# Patient Record
Sex: Male | Born: 1965 | Hispanic: No | Smoking: Current every day smoker
Health system: Southern US, Community
[De-identification: ages and names within clinical notes are randomized; demographics above are authoritative.]

## PROBLEM LIST (undated history)

## (undated) DIAGNOSIS — M5431 Sciatica, right side: Secondary | ICD-10-CM

## (undated) DIAGNOSIS — I639 Cerebral infarction, unspecified: Secondary | ICD-10-CM

## (undated) DIAGNOSIS — F419 Anxiety disorder, unspecified: Secondary | ICD-10-CM

## (undated) DIAGNOSIS — F32A Depression, unspecified: Secondary | ICD-10-CM

## (undated) DIAGNOSIS — J342 Deviated nasal septum: Secondary | ICD-10-CM

## (undated) DIAGNOSIS — F29 Unspecified psychosis not due to a substance or known physiological condition: Secondary | ICD-10-CM

## (undated) HISTORY — DX: Sciatica, right side: M54.31

## (undated) HISTORY — DX: Deviated nasal septum: J34.2

## (undated) HISTORY — PX: KNEE SURGERY: SHX244

## (undated) HISTORY — DX: Anxiety disorder, unspecified: F41.9

## (undated) HISTORY — DX: Depression, unspecified: F32.A

## (undated) HISTORY — DX: Unspecified psychosis not due to a substance or known physiological condition: F29

## (undated) HISTORY — DX: Cerebral infarction, unspecified: I63.9

---

## 2020-08-06 ENCOUNTER — Emergency Department (HOSPITAL_COMMUNITY): Payer: Medicare Other

## 2020-08-06 ENCOUNTER — Emergency Department (HOSPITAL_COMMUNITY)
Admission: EM | Admit: 2020-08-06 | Discharge: 2020-08-07 | Disposition: A | Discharge status: Home / Self Care | Payer: Medicare Other | Attending: Emergency Medicine | Admitting: Emergency Medicine

## 2020-08-06 ENCOUNTER — Encounter (HOSPITAL_COMMUNITY): Payer: Self-pay | Admitting: Emergency Medicine

## 2020-08-06 ENCOUNTER — Other Ambulatory Visit: Payer: Self-pay

## 2020-08-06 DIAGNOSIS — R29810 Facial weakness: Secondary | ICD-10-CM | POA: Diagnosis not present

## 2020-08-06 DIAGNOSIS — R0609 Other forms of dyspnea: Secondary | ICD-10-CM | POA: Diagnosis not present

## 2020-08-06 DIAGNOSIS — I6350 Cerebral infarction due to unspecified occlusion or stenosis of unspecified cerebral artery: Secondary | ICD-10-CM | POA: Diagnosis not present

## 2020-08-06 DIAGNOSIS — Z20822 Contact with and (suspected) exposure to covid-19: Secondary | ICD-10-CM | POA: Insufficient documentation

## 2020-08-06 DIAGNOSIS — Z5321 Procedure and treatment not carried out due to patient leaving prior to being seen by health care provider: Secondary | ICD-10-CM | POA: Diagnosis not present

## 2020-08-06 LAB — COMPREHENSIVE METABOLIC PANEL
ALT: 47 U/L — ABNORMAL HIGH (ref 0–44)
AST: 37 U/L (ref 15–41)
Albumin: 4.1 g/dL (ref 3.5–5.0)
Alkaline Phosphatase: 83 U/L (ref 38–126)
Anion gap: 10 (ref 5–15)
BUN: 10 mg/dL (ref 6–20)
CO2: 27 mmol/L (ref 22–32)
Calcium: 9.7 mg/dL (ref 8.9–10.3)
Chloride: 100 mmol/L (ref 98–111)
Creatinine, Ser: 0.68 mg/dL (ref 0.61–1.24)
GFR calc Af Amer: >60 mL/min (ref >60)
GFR calc non Af Amer: >60 mL/min (ref >60)
Glucose, Bld: 95 mg/dL (ref 70–99)
Potassium: 4.2 mmol/L (ref 3.5–5.1)
Sodium: 137 mmol/L (ref 135–145)
Total Bilirubin: 1.4 mg/dL — ABNORMAL HIGH (ref 0.3–1.2)
Total Protein: 7.1 g/dL (ref 6.5–8.1)

## 2020-08-06 LAB — DIFFERENTIAL
Abs Immature Granulocytes: 0.02 10*3/uL (ref 0.00–0.07)
Basophils Absolute: 0.1 10*3/uL (ref 0.0–0.1)
Basophils Relative: 1 %
Eosinophils Absolute: 0.1 10*3/uL (ref 0.0–0.5)
Eosinophils Relative: 1 %
Immature Granulocytes: 0 %
Lymphocytes Relative: 19 %
Lymphs Abs: 1.1 10*3/uL (ref 0.7–4.0)
Monocytes Absolute: 0.7 10*3/uL (ref 0.1–1.0)
Monocytes Relative: 12 %
Neutro Abs: 3.9 10*3/uL (ref 1.7–7.7)
Neutrophils Relative %: 67 %

## 2020-08-06 LAB — CBC
HCT: 49.5 % (ref 39.0–52.0)
Hemoglobin: 16.4 g/dL (ref 13.0–17.0)
MCH: 33 pg (ref 26.0–34.0)
MCHC: 33.1 g/dL (ref 30.0–36.0)
MCV: 99.6 fL (ref 80.0–100.0)
Platelets: 259 10*3/uL (ref 150–400)
RBC: 4.97 MIL/uL (ref 4.22–5.81)
RDW: 12.1 % (ref 11.5–15.5)
WBC: 5.9 10*3/uL (ref 4.0–10.5)
nRBC: 0 % (ref 0.0–0.2)

## 2020-08-06 LAB — CBG MONITORING, ED: Glucose-Capillary: 87 mg/dL (ref 70–99)

## 2020-08-06 LAB — I-STAT CHEM 8, ED
BUN: 12 mg/dL (ref 6–20)
Calcium, Ion: 1.17 mmol/L (ref 1.15–1.40)
Chloride: 102 mmol/L (ref 98–111)
Creatinine, Ser: 0.7 mg/dL (ref 0.61–1.24)
Glucose, Bld: 93 mg/dL (ref 70–99)
HCT: 50 % (ref 39.0–52.0)
Hemoglobin: 17 g/dL (ref 13.0–17.0)
Potassium: 4.2 mmol/L (ref 3.5–5.1)
Sodium: 140 mmol/L (ref 135–145)
TCO2: 28 mmol/L (ref 22–32)

## 2020-08-06 LAB — PROTIME-INR
INR: 0.9 (ref 0.8–1.2)
Prothrombin Time: 12.1 seconds (ref 11.4–15.2)

## 2020-08-06 LAB — APTT: aPTT: 33 seconds (ref 24–36)

## 2020-08-06 NOTE — ED Notes (Signed)
Pt states that he does not want to stay due to the wait time. Pt advised to return if symptoms worsen.

## 2020-08-06 NOTE — ED Triage Notes (Signed)
Pt here via EMS from [facility] after he presented there for f/u from facial droop yesterday that began at 5pm but then resolved. Pt also endorses shob with exertion for one month. Four negative covid tests. Quit smoking cold [place] on Sunday night.

## 2020-08-07 ENCOUNTER — Telehealth: Payer: Self-pay | Admitting: *Deleted

## 2020-08-07 NOTE — Telephone Encounter (Signed)
Pt wife called for referral for pt to [facility].  RNCM reviewed chart to find that pt left without being seen.  RNCM explained to wife that because he LWBS, we can not refer him to a specialist.

## 2020-08-11 ENCOUNTER — Other Ambulatory Visit: Payer: Self-pay | Admitting: Physician Assistant

## 2020-08-11 DIAGNOSIS — I639 Cerebral infarction, unspecified: Secondary | ICD-10-CM

## 2020-08-11 DIAGNOSIS — F17200 Nicotine dependence, unspecified, uncomplicated: Secondary | ICD-10-CM

## 2020-08-13 ENCOUNTER — Telehealth: Payer: Self-pay

## 2020-08-13 NOTE — Telephone Encounter (Signed)
NOTES ON FILE FROM  [facility] [phone] SENT REFERRAL TO SCHEDULING 

## 2020-08-14 ENCOUNTER — Encounter: Payer: Self-pay | Admitting: *Deleted

## 2020-08-14 ENCOUNTER — Ambulatory Visit
Admission: RE | Admit: 2020-08-14 | Discharge: 2020-08-14 | Disposition: A | Discharge status: Home / Self Care | Payer: Medicare Other | Source: Ambulatory Visit | Attending: Physician Assistant | Admitting: Physician Assistant

## 2020-08-14 DIAGNOSIS — I639 Cerebral infarction, unspecified: Secondary | ICD-10-CM

## 2020-08-18 ENCOUNTER — Ambulatory Visit (INDEPENDENT_AMBULATORY_CARE_PROVIDER_SITE_OTHER): Payer: Medicare Other | Admitting: Diagnostic Neuroimaging

## 2020-08-18 ENCOUNTER — Telehealth: Payer: Self-pay | Admitting: Diagnostic Neuroimaging

## 2020-08-18 ENCOUNTER — Other Ambulatory Visit: Payer: Self-pay

## 2020-08-18 ENCOUNTER — Encounter: Payer: Self-pay | Admitting: Diagnostic Neuroimaging

## 2020-08-18 VITALS — BP 134/87 | HR 64 | Ht 71.0 in | Wt 166.4 lb

## 2020-08-18 DIAGNOSIS — I63411 Cerebral infarction due to embolism of right middle cerebral artery: Secondary | ICD-10-CM

## 2020-08-18 DIAGNOSIS — E782 Mixed hyperlipidemia: Secondary | ICD-10-CM

## 2020-08-18 NOTE — Telephone Encounter (Signed)
Medicare order sent to GI. No auth they will reach out to the patient to schedule.  

## 2020-08-18 NOTE — Progress Notes (Signed)
[facility]  PATIENT: Kevin Clarke. DOB: 13-Jul-1966  REFERRING CLINICIAN: Roger Kill, * HISTORY FROM: patient  REASON FOR VISIT: new consult    HISTORICAL  CHIEF COMPLAINT:  Chief Complaint  Patient presents with  . Cerebrovascular Accident    rm 7 New Pt  sig other- [name]    HISTORY OF PRESENT ILLNESS:   54 year old male here for evaluation of stroke.  08/05/2020 patient had sudden onset of not feeling well, left side not working.  Patient significant other noted that right side of face seem to be moving abnormally.  Patient had been having elevated blood pressures for the prior 1 week.  Patient went to urgent care on 08/06/2020 and then referred to ER for further evaluation.  Patient had CT scan of the head which showed acute to subacute right basal ganglia ischemic infarct.  Patient left before evaluation could be completed.  Since that time patient continues to have some weakness and coordination problems with the left arm and left leg.  He has some mild intermittent slurred speech.  Blood pressures have improved.  Patient was not on any medications at the time of stroke.  Now he is on aspirin, Plavix, atorvastatin.  Patient has history of smoking and alcohol abuse.  Patient is trying to reduce these.   REVIEW OF SYSTEMS: Full 14 system review of systems performed and negative with exception of: As per HPI.  ALLERGIES: Allergies  Allergen Reactions  . Corn-Containing Products Anaphylaxis  . Rosuvastatin Other (See Comments)    myalgias  . Penicillins Other (See Comments)    Allergy reaction unknown    HOME MEDICATIONS: Outpatient Medications Prior to Visit  Medication Sig Dispense Refill  . aspirin EC 81 MG tablet Take 162 mg by mouth daily. Swallow whole.    Marland Kitchen atorvastatin (LIPITOR) 10 MG tablet Take 10 mg by mouth daily.    . clopidogrel (PLAVIX) 75 MG tablet Take 75 mg by mouth daily.    Marland Kitchen co-enzyme Q-10 30 MG capsule Take 30  mg by mouth 3 (three) times daily.     No facility-administered medications prior to visit.    PAST MEDICAL HISTORY: Past Medical History:  Diagnosis Date  . Anxiety   . CVA (cerebral vascular accident) (HCC)   . Depression   . Deviated nasal septum   . Psychosis (HCC)   . Sciatica of right side     PAST SURGICAL HISTORY: Past Surgical History:  Procedure Laterality Date  . KNEE SURGERY Right    x 7    FAMILY HISTORY: Family History  Problem Relation Age of Onset  . Heart disease Mother   . Hypertension Mother   . Heart disease Father        CABG  . Hypertension Father   . Arthritis/Rheumatoid Father   . Multiple sclerosis Sister   . Stroke Maternal Aunt   . Stroke Maternal Uncle     SOCIAL HISTORY: Social History   Socioeconomic History  . Marital status: Significant Other    Spouse name: [name]  . Number of children: 2  . Years of education: Not on file  . Highest education level: Bachelor's degree (e.g., BA, AB, BS)  Occupational History    Comment: disability  Tobacco Use  . Smoking status: Former Smoker    Packs/day: 1.50    Years: 25.00    Pack years: 51.50    Quit date: 08/03/2020    Years since quitting: 0.0  . Smokeless tobacco: Never  Used  . Tobacco comment: 08/18/20 trying to quit   Vaping Use  . Vaping Use: Never used  Substance and Sexual Activity  . Alcohol use: Yes    Comment: 08/18/20 3-4  beers daily  . Drug use: Never  . Sexual activity: Not on file  Other Topics Concern  . Not on file  Social History Narrative   Caffeine- Mtn Dew a few a week, sweet tea 1 daily or less   Social Determinants of Health   Financial Resource Strain:   . Difficulty of Paying Living Expenses: Not on file  Food Insecurity:   . Worried About Programme researcher, broadcasting/film/video in the Last Year: Not on file  . Ran Out of Food in the Last Year: Not on file  Transportation Needs:   . Lack of Transportation (Medical): Not on file  . Lack of Transportation  (Non-Medical): Not on file  Physical Activity:   . Days of Exercise per Week: Not on file  . Minutes of Exercise per Session: Not on file  Stress:   . Feeling of Stress : Not on file  Social Connections:   . Frequency of Communication with Friends and Family: Not on file  . Frequency of Social Gatherings with Friends and Family: Not on file  . Attends Religious Services: Not on file  . Active Member of Clubs or Organizations: Not on file  . Attends Banker Meetings: Not on file  . Marital Status: Not on file  Intimate Partner Violence:   . Fear of Current or Ex-Partner: Not on file  . Emotionally Abused: Not on file  . Physically Abused: Not on file  . Sexually Abused: Not on file     PHYSICAL EXAM  GENERAL EXAM/CONSTITUTIONAL: Vitals:  Vitals:   08/18/20 0817  BP: 134/87  Pulse: 64  Weight: 166 lb 6.4 oz (75.5 kg)  Height: 5\' 11"  (1.803 m)     Body mass index is 23.21 kg/m. Wt Readings from Last 3 Encounters:  08/18/20 166 lb 6.4 oz (75.5 kg)  08/06/20 161 lb (73 kg)     Patient is in no distress; well developed, nourished and groomed; neck is supple  CARDIOVASCULAR:  Examination of carotid arteries is normal; no carotid bruits  Regular rate and rhythm, no murmurs  Examination of peripheral vascular system by observation and palpation is normal  EYES:  Ophthalmoscopic exam of optic discs and posterior segments is normal; no papilledema or hemorrhages  No exam data present  MUSCULOSKELETAL:  Gait, strength, tone, movements noted in Neurologic exam below  NEUROLOGIC: MENTAL STATUS:  No flowsheet data found.  awake, alert, oriented to person, place and time  recent and remote memory intact  normal attention and concentration  language fluent, comprehension intact, naming intact  fund of knowledge appropriate  CRANIAL NERVE:   2nd - no papilledema on fundoscopic exam  2nd, 3rd, 4th, 6th - pupils equal and reactive to light,  visual fields full to confrontation, extraocular muscles intact, no nystagmus  5th - facial sensation --> DECR ON LEFT SIDE  7th - facial strength symmetric  8th - hearing intact  9th - palate elevates symmetrically, uvula midline  11th - shoulder shrug symmetric  12th - tongue protrusion midline  MOTOR:   normal bulk and tone, full strength in the BUE, BLE; EXCEPT LEFT DELTOID AND LEFT HIP FLEX 4+  SENSORY:   normal and symmetric to light touch, temperature, vibration; EXCEPT DECR IN LEFT ARM AND LEFT LEG  COORDINATION:   finger-nose-finger, fine finger movements SLOW ON LEFT  REFLEXES:   deep tendon reflexes present and symmetric  GAIT/STATION:   narrow based gait     DIAGNOSTIC DATA (LABS, IMAGING, TESTING) - I reviewed patient records, labs, notes, testing and imaging myself where available.  Lab Results  Component Value Date   WBC 5.9 08/06/2020   HGB 17.0 08/06/2020   HCT 50.0 08/06/2020   MCV 99.6 08/06/2020   PLT 259 08/06/2020      Component Value Date/Time   NA 140 08/06/2020 1656   K 4.2 08/06/2020 1656   CL 102 08/06/2020 1656   CO2 27 08/06/2020 1644   GLUCOSE 93 08/06/2020 1656   BUN 12 08/06/2020 1656   CREATININE 0.70 08/06/2020 1656   CALCIUM 9.7 08/06/2020 1644   PROT 7.1 08/06/2020 1644   ALBUMIN 4.1 08/06/2020 1644   AST 37 08/06/2020 1644   ALT 47 (H) 08/06/2020 1644   ALKPHOS 83 08/06/2020 1644   BILITOT 1.4 (H) 08/06/2020 1644   GFRNONAA >60 08/06/2020 1644   GFRAA >60 08/06/2020 1644   No results found for: CHOL, HDL, LDLCALC, LDLDIRECT, TRIG, CHOLHDL No results found for: ACZY6A No results found for: VITAMINB12 No results found for: TSH   CHEMISTRY PANELS [facility] 08/11/2020 Component    LDL Direct  Total Cholesterol  Triglycerides  HDL Cholesterol  Total Chol / HDL Cholesterol  Non-HDL Cholesterol  Coronary Heart Disease Risk Table  TSH  Component 08/11/2020 08/11/2020      LDL  Direct 153High   --  Total Cholesterol 233High   --  Triglycerides 208High   --  HDL Cholesterol 40 --  Total Chol / HDL Cholesterol 5.8High   --  Non-HDL Cholesterol 193 --  Coronary Heart Disease Risk Table -- --  TSH -- 1.523     08/14/20 Carotid u/s - Color duplex indicates moderate heterogeneous plaque with no hemodynamically significant stenosis by duplex criteria in the extracranial cerebrovascular circulation.  08/06/20 CT head [I reviewed images myself and agree with interpretation. -VRP]  - 3 cm acute/early subacute right basal ganglia infarct affecting the caudate and lentiform nuclei as well as internal capsule.   ASSESSMENT AND PLAN  54 y.o. year old male here with right basal ganglia ischemic infarction with resultant left-sided numbness and weakness.  Symptoms likely due to small vessel thrombosis in setting of hypertension, hyperlipidemia, tobacco abuse and alcohol abuse.  Dx:  1. Cerebrovascular accident (CVA) due to embolism of right middle cerebral artery (HCC)   2. Mixed hyperlipidemia      PLAN:  RIGHT BASAL GANGLIA STROKE - continue aspirin 81mg  + plavix 75mg  daily x 3 months; then reduce to plavix 75mg  daily - continue atorvastatin 10mg   - monitor BP - check MRI, MRA, echo - smoking cessation reviewed; avoid alcohol    Orders Placed This Encounter  Procedures  . MR BRAIN WO CONTRAST  . MR ANGIO HEAD WO CONTRAST  . ECHOCARDIOGRAM COMPLETE BUBBLE STUDY   Return for pending if symptoms worsen or fail to improve, return to PCP.    , MD 08/18/2020, 9:00 AM Certified in Neurology, Neurophysiology and Neuroimaging  [facility] [address], [address] [place], 08/20/2020 [facility] [phone]

## 2020-08-18 NOTE — Patient Instructions (Addendum)
-   continue aspirin 81mg  + plavix 75mg  daily x 3 months; then reduce to plavix 75mg  daily - continue atorvastatin 10mg   - monitor BP - check MRI, MRA, echo - smoking cessation; avoid alcohol

## 2020-08-20 ENCOUNTER — Other Ambulatory Visit: Payer: Self-pay | Admitting: Diagnostic Neuroimaging

## 2020-08-20 DIAGNOSIS — Z77018 Contact with and (suspected) exposure to other hazardous metals: Secondary | ICD-10-CM

## 2020-08-21 ENCOUNTER — Other Ambulatory Visit: Payer: Self-pay

## 2020-08-21 ENCOUNTER — Ambulatory Visit
Admission: RE | Admit: 2020-08-21 | Discharge: 2020-08-21 | Disposition: A | Discharge status: Home / Self Care | Payer: Medicare Other | Source: Ambulatory Visit | Attending: Diagnostic Neuroimaging | Admitting: Diagnostic Neuroimaging

## 2020-08-21 ENCOUNTER — Ambulatory Visit
Admission: RE | Admit: 2020-08-21 | Discharge: 2020-08-21 | Disposition: A | Discharge status: Home / Self Care | Payer: Medicare Other | Source: Ambulatory Visit | Attending: Physician Assistant | Admitting: Physician Assistant

## 2020-08-21 DIAGNOSIS — F17200 Nicotine dependence, unspecified, uncomplicated: Secondary | ICD-10-CM

## 2020-08-21 DIAGNOSIS — I63411 Cerebral infarction due to embolism of right middle cerebral artery: Secondary | ICD-10-CM

## 2020-08-21 DIAGNOSIS — Z77018 Contact with and (suspected) exposure to other hazardous metals: Secondary | ICD-10-CM

## 2020-08-25 NOTE — Progress Notes (Signed)
Cardiology Office Note   Date:  08/26/2020   ID:  Kevin Clarke., DOB 01-09-1966, MRN [id number]  PCP:  Roger Kill, PA-C  Cardiologist:   No primary care provider on file. Referring:  Roger Kill, PA-C  Chief Complaint  Patient presents with  . Cerebrovascular Accident      History of Present Illness: Kevin Clarke. is a 54 y.o. male who presents for follow up of a right MCA.  He was in the ED for facial numbness and had a CT but he left the ED without being seen he had had his initial evaluation at [facility] urgent care and was sent to the [facility] ED where he had a CT.  However, there was a long wait and he did not want to stay.  He has been seen by neurology as an outpatient.    He has been treated with Plavix and aspirin.  TEE with bubble study is planned.  The patient has no prior cardiac history before this.  He wonders if some of the symptoms could have been related to an upper respiratory infection he had a lot of coughing he had been doing.  He had not seen a doctor in years.  He does have a strong family history of vascular disease.  He has a smoking history and is now trying to cut back.  However, he has never had any prior cardiac testing or treatment.  He said that the day that his event happened he had some slurred speech and facial tingling.  They presented more than 24 hours after the event no when they were encouraged by family member to go get checked out.  His partner took him to the urgent care.  He now has some residual left-sided weakness and some slurred words.  He is not describing palpitations, presyncope or syncope.  He has had no chest pressure, neck or arm discomfort.  Has had no weight gain or edema.   Past Medical History:  Diagnosis Date  . Anxiety   . CVA (cerebral vascular accident) (HCC)   . Depression   . Deviated nasal septum   . Psychosis (HCC)   . Sciatica of right side     Past Surgical History:  Procedure Laterality  Date  . KNEE SURGERY Right    x 7     Current Outpatient Medications  Medication Sig Dispense Refill  . aspirin EC 81 MG tablet Take 162 mg by mouth daily. Swallow whole.    Marland Kitchen atorvastatin (LIPITOR) 10 MG tablet Take 10 mg by mouth daily.    . clopidogrel (PLAVIX) 75 MG tablet Take 75 mg by mouth daily.    Marland Kitchen co-enzyme Q-10 30 MG capsule Take 30 mg by mouth daily.      No current facility-administered medications for this visit.    Allergies:   Corn-containing products, Rosuvastatin, and Penicillins    Social History:  The patient  reports that he quit smoking about 3 weeks ago. He has a 37.50 pack-year smoking history. He has never used smokeless tobacco. He reports current alcohol use. He reports that he does not use drugs.   Family History:  The patient's family history includes Arthritis/Rheumatoid in his father; Heart disease in his mother; Heart disease (age of onset: 10) in his father; Hypertension in his father and mother; Multiple sclerosis in his sister; Stroke in his maternal aunt and maternal uncle.    ROS:  Please see the history of present illness.  Otherwise, review of systems are positive for ED.   All other systems are reviewed and negative.    PHYSICAL EXAM: VS:  BP 116/82   Pulse 87   Ht 5\' 11"  (1.803 m)   Wt 165 lb 6.4 oz (75 kg)   SpO2 97%   BMI 23.07 kg/m  , BMI Body mass index is 23.07 kg/m.  GENERAL:  Well appearing HEENT:  Pupils equal round and reactive, fundi not visualized, oral mucosa unremarkable NECK:  No jugular venous distention, waveform within normal limits, carotid upstroke brisk and symmetric, no bruits, no thyromegaly LYMPHATICS:  No cervical, inguinal adenopathy LUNGS:  Clear to auscultation bilaterally BACK:  No CVA tenderness CHEST:  Unremarkable HEART:  PMI not displaced or sustained,S1 and S2 within normal limits, no S3, no S4, no clicks, no rubs, no murmurs ABD:  Flat, positive bowel sounds normal in frequency in pitch, no  bruits, no rebound, no guarding, no midline pulsatile mass, no hepatomegaly, no splenomegaly EXT:  2 plus pulses throughout, no edema, no cyanosis no clubbing SKIN:  No rashes no nodules NEURO:  Cranial nerves II through XII grossly intact, motor grossly intact throughout PSYCH:  Cognitively intact, oriented to person place and time    EKG:  EKG is not ordered today. The ekg ordered 08/06/2020 demonstrates normal sinus rhythm, rate 72, axis within normal limits, intervals within normal limits, no acute ST-T wave changes.   Recent Labs: 08/06/2020: ALT 47; BUN 12; Creatinine, Ser 0.70; Hemoglobin 17.0; Platelets 259; Potassium 4.2; Sodium 140    Lipid Panel No results found for: CHOL, TRIG, HDL, CHOLHDL, VLDL, LDLCALC, LDLDIRECT    Wt Readings from Last 3 Encounters:  08/26/20 165 lb 6.4 oz (75 kg)  08/18/20 166 lb 6.4 oz (75.5 kg)  08/06/20 161 lb (73 kg)      Other studies Reviewed: Additional studies/ records that were reviewed today include: ED records.  MRI.  Neurology note. Review of the above records demonstrates:  Please see elsewhere in the note.     ASSESSMENT AND PLAN:  CVA:  This was embolic to the right MCA.   Echo with bubble study as been ordered.   Carotid was negative for significant plaque.  I suspect this was atheroembolic.  He does have aortic atherosclerosis noted on his CT.  We talked quite a bit about this.  I am going to order a 4-week event monitor to make sure he does not have any atrial fibrillation though I think this is low yield.  I will look for the results of the echo but doubt that a TEE would be helpful as I think this is low risk.  Instead he needs aggressive risk reduction for his cardiovascular risk factors and in particular his dyslipidemia tobacco abuse.  HTN: His blood pressure is controlled.  He will keep a blood pressure diary.  TOBACCO ABUSE: He is cutting back and with talked about this at length.  DYSLIPIDEMIA: He was a started on  Lipitor.  His LDL was 153 with a total of 233.  He has changed his diet completely.  I think her goal LDL should be less than 70 and he will probably need a change in his statin but I will review these results.  ELEVATED CORONARY CALCIUM: I will likely screen him with a POET (Plain Old Exercise Treadmill) in the weeks to come.  He has no active chest pain symptoms.  ED: I would consider Viagra but only after he is recovered from his  stroke and he has had stress testing.  COVID VACCINE: He does not want to talk about the vaccine.   Current medicines are reviewed at length with the patient today.  The patient does not have concerns regarding medicines.  The following changes have been made:  no change  Labs/ tests ordered today include:   Orders Placed This Encounter  Procedures  . CARDIAC EVENT MONITOR     Disposition:   FU with me in six weeks.      Signed, Rollene Rotunda, MD  08/26/2020 5:25 PM    [facility]

## 2020-08-26 ENCOUNTER — Ambulatory Visit (INDEPENDENT_AMBULATORY_CARE_PROVIDER_SITE_OTHER): Payer: Medicare Other | Admitting: Cardiology

## 2020-08-26 ENCOUNTER — Encounter: Payer: Self-pay | Admitting: Cardiology

## 2020-08-26 ENCOUNTER — Other Ambulatory Visit: Payer: Self-pay

## 2020-08-26 VITALS — BP 116/82 | HR 87 | Ht 71.0 in | Wt 165.4 lb

## 2020-08-26 DIAGNOSIS — I63411 Cerebral infarction due to embolism of right middle cerebral artery: Secondary | ICD-10-CM

## 2020-08-26 DIAGNOSIS — R002 Palpitations: Secondary | ICD-10-CM

## 2020-08-26 NOTE — Patient Instructions (Signed)
Medication Instructions:  The current medical regimen is effective;  continue present plan and medications.  *If you need a refill on your cardiac medications before your next appointment, please call your pharmacy*  Testing/Procedures: Your physician has recommended that you wear an event monitor 30 day. Event monitors are medical devices that record the hearts electrical activity. Doctors most often [place] these monitors to diagnose arrhythmias. Arrhythmias are problems with the speed or rhythm of the heartbeat. The monitor is a small, portable device. You can wear one while you do your normal daily activities. This is usually used to diagnose what is causing palpitations/syncope (passing out). This monitor will be mailed to you, someone will call and discuss this before mailing.   Follow-Up: At [facility], you and your health needs are our priority.  As part of our continuing mission to provide you with exceptional heart care, we have created designated Provider Care Teams.  These Care Teams include your primary Cardiologist (physician) and Advanced Practice Providers (APPs -  Physician Assistants and Nurse Practitioners) who all work together to provide you with the care you need, when you need it.  We recommend signing up for the patient portal called "MyChart".  Sign up information is provided on this After Visit Summary.  MyChart is used to connect with patients for Virtual Visits (Telemedicine).  Patients are able to view lab/test results, encounter notes, upcoming appointments, etc.  Non-urgent messages can be sent to your provider as well.   To learn more about what you can do with MyChart, go to [email or web address].    Your next appointment:   6 week(s)  The format for your next appointment:   In Person  Provider:   Rollene Rotunda, MD

## 2020-08-27 ENCOUNTER — Telehealth: Payer: Self-pay | Admitting: Radiology

## 2020-08-27 ENCOUNTER — Encounter: Payer: Self-pay | Admitting: *Deleted

## 2020-08-27 NOTE — Telephone Encounter (Signed)
Enrolled patient for a 30 day [employer] Event Monitor to be mailed to patients home  

## 2020-09-03 ENCOUNTER — Other Ambulatory Visit: Payer: Self-pay

## 2020-09-03 ENCOUNTER — Ambulatory Visit (HOSPITAL_COMMUNITY): Payer: Medicare Other | Attending: Internal Medicine

## 2020-09-03 DIAGNOSIS — I63411 Cerebral infarction due to embolism of right middle cerebral artery: Secondary | ICD-10-CM | POA: Insufficient documentation

## 2020-09-03 LAB — ECHOCARDIOGRAM COMPLETE BUBBLE STUDY
Area-P 1/2: 3.65 cm2
Calc EF: 54.1 %
S' Lateral: 3.8 cm
Single Plane A2C EF: 55.9 %
Single Plane A4C EF: 54.6 %

## 2020-09-03 MED ORDER — SODIUM CHLORIDE 0.9% FLUSH
10.0000 mL | INTRAVENOUS | Status: AC | PRN
  Administered 2020-09-03: 20 mL via INTRAVENOUS

## 2020-09-04 ENCOUNTER — Encounter: Payer: Self-pay | Admitting: *Deleted

## 2020-10-03 ENCOUNTER — Ambulatory Visit: Payer: Medicare Other | Admitting: Diagnostic Neuroimaging

## 2020-10-09 DIAGNOSIS — E785 Hyperlipidemia, unspecified: Secondary | ICD-10-CM | POA: Insufficient documentation

## 2020-10-09 DIAGNOSIS — I10 Essential (primary) hypertension: Secondary | ICD-10-CM | POA: Insufficient documentation

## 2020-10-09 DIAGNOSIS — I639 Cerebral infarction, unspecified: Secondary | ICD-10-CM | POA: Insufficient documentation

## 2020-10-09 NOTE — Progress Notes (Signed)
Cardiology Office Note   Date:  10/10/2020   ID:  Kevin Dane., DOB 12-11-1966, MRN [id number]  PCP:  Roger Kill, PA-C  Cardiologist:   Rollene Rotunda, MD Referring:  Roger Kill, PA-C  Chief Complaint  Patient presents with   Cerebrovascular Accident      History of Present Illness: Kevin Sheeler. is a 54 y.o. male who presents for follow up of a right MCA stroke  He was in the ED for facial numbness and had a CT but he left the ED without being seen he had had his initial evaluation at [facility] urgent care and was sent to the [facility] ED where he had a CT.  However, there was a long wait and he did not want to stay.  He has been seen by neurology as an outpatient.    He has been treated with Plavix and aspirin.  He had a negative echo with bubble study.    Since I last saw him he has residual left arm weakness and some left leg weakness and problems with his speech.  Despite this he is very active.  He denies any palpitations, presyncope or syncope.  He said no chest pressure, neck or arm discomfort.  He changed his diet.    Past Medical History:  Diagnosis Date   Anxiety    CVA (cerebral vascular accident) (HCC)    Depression    Deviated nasal septum    Psychosis (HCC)    Sciatica of right side     Past Surgical History:  Procedure Laterality Date   KNEE SURGERY Right    x 7     Current Outpatient Medications  Medication Sig Dispense Refill   aspirin EC 81 MG tablet Take 162 mg by mouth daily. Swallow whole.     atorvastatin (LIPITOR) 10 MG tablet Take 10 mg by mouth daily.     clopidogrel (PLAVIX) 75 MG tablet Take 75 mg by mouth daily.     co-enzyme Q-10 30 MG capsule Take 30 mg by mouth daily.      No current facility-administered medications for this visit.   Facility-Administered Medications Ordered in Other Visits  Medication Dose Route Frequency Provider Last Rate Last Admin   sodium chloride flush (NS) 0.9 %  injection 10 mL  10 mL Intravenous PRN Penumalli, Vikram R, MD   20 mL at 09/03/20 1300    Allergies:   Corn-containing products, Rosuvastatin, and Penicillins    ROS:  Please see the history of present illness.   Otherwise, review of systems are positive for ED.   All other systems are reviewed and negative.    PHYSICAL EXAM: VS:  BP 124/74    Pulse 63    Ht 5\' 11"  (1.803 m)    Wt 162 lb (73.5 kg)    SpO2 98%    BMI 22.59 kg/m  , BMI Body mass index is 22.59 kg/m.  GENERAL:  Well appearing NECK:  No jugular venous distention, waveform within normal limits, carotid upstroke brisk and symmetric, no bruits, no thyromegaly LUNGS:  Clear to auscultation bilaterally CHEST:  Unremarkable HEART:  PMI not displaced or sustained,S1 and S2 within normal limits, no S3, no S4, no clicks, no rubs, no murmurs ABD:  Flat, positive bowel sounds normal in frequency in pitch, no bruits, no rebound, no guarding, no midline pulsatile mass, no hepatomegaly, no splenomegaly EXT:  2 plus pulses throughout, no edema, no cyanosis no clubbing  EKG:  EKG is ordered today. The ekg ordered 08/06/2020 demonstrates normal sinus rhythm, rate 63, axis within normal limits, intervals within normal limits, no acute ST-T wave changes.   Recent Labs: 08/06/2020: ALT 47; BUN 12; Creatinine, Ser 0.70; Hemoglobin 17.0; Platelets 259; Potassium 4.2; Sodium 140    Lipid Panel No results found for: CHOL, TRIG, HDL, CHOLHDL, VLDL, LDLCALC, LDLDIRECT    Wt Readings from Last 3 Encounters:  10/10/20 162 lb (73.5 kg)  08/26/20 165 lb 6.4 oz (75 kg)  08/18/20 166 lb 6.4 oz (75.5 kg)      Other studies Reviewed: Additional studies/ records that were reviewed today include: ED records.  MRI.  Neurology note. Review of the above records demonstrates:  Please see elsewhere in the note.     ASSESSMENT AND PLAN:  CVA:  This was embolic to the right MCA.   Echo with bubble study was negative.   He was to have a monitor but  he could not get it working at home he reports because of his Internet.  He became frustrated with that and they did send it back.    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  However, I still want to try to see if we can rule out arrhythmia and we will look to see what kind of monitor for whether he should get an implantable loop.  HTN: His blood pressure is controlled.  He will continue the meds as listed.   TOBACCO ABUSE:    We have talked about the need to stop smoking completely.  DYSLIPIDEMIA:   LDL is 153 previously with a goal LDL less than 70 and I will defer to his primary provider who is following this and recently adjusted his Lipitor.   ELEVATED CORONARY CALCIUM:   I will bring him back for POET (Plain Old Exercise Treadmill)  ED: This is no longer a problem.  No therapy is needed.  COVID VACCINE:   He has not wanted to talk about getting a vaccine.   Current medicines are reviewed at length with the patient today.  The patient does not have concerns regarding medicines.  The following changes have been made:  None  Labs/ tests ordered today include:     Orders Placed This Encounter  Procedures   EXERCISE TOLERANCE TEST (ETT)   EKG 12-Lead     Disposition:   FU with me in  6 months.   Signed, Rollene Rotunda, MD  10/10/2020 12:55 PM    [facility]

## 2020-10-10 ENCOUNTER — Ambulatory Visit (INDEPENDENT_AMBULATORY_CARE_PROVIDER_SITE_OTHER): Payer: Medicare Other | Admitting: Cardiology

## 2020-10-10 ENCOUNTER — Other Ambulatory Visit: Payer: Self-pay

## 2020-10-10 ENCOUNTER — Encounter: Payer: Self-pay | Admitting: Cardiology

## 2020-10-10 ENCOUNTER — Encounter: Payer: Self-pay | Admitting: *Deleted

## 2020-10-10 VITALS — BP 124/74 | HR 63 | Ht 71.0 in | Wt 162.0 lb

## 2020-10-10 DIAGNOSIS — Z720 Tobacco use: Secondary | ICD-10-CM | POA: Diagnosis not present

## 2020-10-10 DIAGNOSIS — I63411 Cerebral infarction due to embolism of right middle cerebral artery: Secondary | ICD-10-CM

## 2020-10-10 DIAGNOSIS — I639 Cerebral infarction, unspecified: Secondary | ICD-10-CM | POA: Diagnosis not present

## 2020-10-10 DIAGNOSIS — E785 Hyperlipidemia, unspecified: Secondary | ICD-10-CM | POA: Diagnosis not present

## 2020-10-10 DIAGNOSIS — I10 Essential (primary) hypertension: Secondary | ICD-10-CM | POA: Diagnosis not present

## 2020-10-10 NOTE — Patient Instructions (Signed)
Medication Instructions:  No changes *If you need a refill on your cardiac medications before your next appointment, please call your pharmacy*   Lab Work: None orderdered If you have labs (blood work) drawn today and your tests are completely normal, you will receive your results only by: Marland Kitchen MyChart Message (if you have MyChart) OR . A paper copy in the mail If you have any lab test that is abnormal or we need to change your treatment, we will call you to review the results.   Testing/Procedures: Your physician has requested that you have an exercise tolerance test. For further information please visit [email or web address] Please also follow instruction sheet, as given. This will take place at [address] [facility], [address].  Do not drink or eat foods with caffeine for 24 hours before the test. (Chocolate, coffee, tea, or energy drinks)  If you use an inhaler, bring it with you to the test.  Do not smoke for 4 hours before the test.  Wear comfortable shoes and clothing.  You will need a COVID test prior to the GXT    Follow-Up: At [facility], you and your health needs are our priority.  As part of our continuing mission to provide you with exceptional heart care, we have created designated Provider Care Teams.  These Care Teams include your primary Cardiologist (physician) and Advanced Practice Providers (APPs -  Physician Assistants and Nurse Practitioners) who all work together to provide you with the care you need, when you need it.  We recommend signing up for the patient portal called "MyChart".  Sign up information is provided on this After Visit Summary.  MyChart is used to connect with patients for Virtual Visits (Telemedicine).  Patients are able to view lab/test results, encounter notes, upcoming appointments, etc.  Non-urgent messages can be sent to your provider as well.   To learn more about what you can do with MyChart, go to [email or web address].    Your next  appointment:   Follow up after the tests

## 2020-10-11 ENCOUNTER — Other Ambulatory Visit (HOSPITAL_COMMUNITY): Payer: Medicare Other

## 2020-10-13 ENCOUNTER — Telehealth: Payer: Self-pay | Admitting: *Deleted

## 2020-10-13 NOTE — Telephone Encounter (Signed)
Left a message for the patient to call back to see about ordering a new cardiac monitor.

## 2020-10-14 ENCOUNTER — Other Ambulatory Visit (HOSPITAL_COMMUNITY)
Admission: RE | Admit: 2020-10-14 | Discharge: 2020-10-14 | Disposition: A | Discharge status: Home / Self Care | Payer: Medicare Other | Source: Ambulatory Visit | Attending: Cardiology | Admitting: Cardiology

## 2020-10-14 DIAGNOSIS — Z01812 Encounter for preprocedural laboratory examination: Secondary | ICD-10-CM | POA: Diagnosis present

## 2020-10-14 DIAGNOSIS — Z20822 Contact with and (suspected) exposure to covid-19: Secondary | ICD-10-CM | POA: Insufficient documentation

## 2020-10-14 LAB — SARS CORONAVIRUS 2 (TAT 6-24 HRS): SARS Coronavirus 2: NEGATIVE

## 2020-10-17 ENCOUNTER — Ambulatory Visit (HOSPITAL_COMMUNITY)
Admission: RE | Admit: 2020-10-17 | Discharge: 2020-10-17 | Disposition: A | Discharge status: Home / Self Care | Payer: Medicare Other | Source: Ambulatory Visit | Attending: Internal Medicine | Admitting: Internal Medicine

## 2020-10-17 ENCOUNTER — Other Ambulatory Visit: Payer: Self-pay

## 2020-10-17 DIAGNOSIS — I639 Cerebral infarction, unspecified: Secondary | ICD-10-CM

## 2020-10-17 LAB — EXERCISE TOLERANCE TEST
Estimated workload: 10.9 METS
Exercise duration (min): 9 min
Exercise duration (sec): 31 s
MPHR: 165 {beats}/min
Peak HR: 148 {beats}/min
Percent HR: 89 %
Rest HR: 71 {beats}/min

## 2020-10-21 NOTE — Telephone Encounter (Signed)
Left a message for the patient to call back to discuss the monitor ordered by Dr. Antoine Poche.

## 2020-10-21 NOTE — Progress Notes (Deleted)
Cardiology Office Note   Date:  10/21/2020   ID:  Kevin Clarke., DOB 1966-01-04, MRN [id number]  PCP:  Roger Kill, PA-C  Cardiologist:   Rollene Rotunda, MD Referring:  Roger Kill, PA-C  No chief complaint on file.     History of Present Illness: Kevin Clarke. is a 54 y.o. male who presents for follow up of a right MCA stroke  He was in the ED for facial numbness and had a CT but he left the ED without being seen he had had his initial evaluation at [facility] urgent care and was sent to the [facility] ED where he had a CT.  However, there was a long wait and he did not want to stay.  He has been seen by neurology as an outpatient.    He has been treated with Plavix and aspirin.  He had a negative echo with bubble study.    ***  *** Since I last saw him he has residual left arm weakness and some left leg weakness and problems with his speech.  Despite this he is very active.  He denies any palpitations, presyncope or syncope.  He said no chest pressure, neck or arm discomfort.  He changed his diet.    Past Medical History:  Diagnosis Date  . Anxiety   . CVA (cerebral vascular accident) (HCC)   . Depression   . Deviated nasal septum   . Psychosis (HCC)   . Sciatica of right side     Past Surgical History:  Procedure Laterality Date  . KNEE SURGERY Right    x 7     Current Outpatient Medications  Medication Sig Dispense Refill  . aspirin EC 81 MG tablet Take 162 mg by mouth daily. Swallow whole.    Marland Kitchen atorvastatin (LIPITOR) 10 MG tablet Take 10 mg by mouth daily.    . clopidogrel (PLAVIX) 75 MG tablet Take 75 mg by mouth daily.    Marland Kitchen co-enzyme Q-10 30 MG capsule Take 30 mg by mouth daily.      No current facility-administered medications for this visit.   Facility-Administered Medications Ordered in Other Visits  Medication Dose Route Frequency Provider Last Rate Last Admin  . sodium chloride flush (NS) 0.9 % injection 10 mL  10 mL  Intravenous PRN Penumalli, Vikram R, MD   20 mL at 09/03/20 1300    Allergies:   Corn-containing products, Rosuvastatin, and Penicillins    ROS:  Please see the history of present illness.   Otherwise, review of systems are positive for ***.   All other systems are reviewed and negative.    PHYSICAL EXAM: VS:  There were no vitals taken for this visit. , BMI There is no height or weight on file to calculate BMI.  GENERAL:  Well appearing NECK:  No jugular venous distention, waveform within normal limits, carotid upstroke brisk and symmetric, no bruits, no thyromegaly LUNGS:  Clear to auscultation bilaterally CHEST:  Unremarkable HEART:  PMI not displaced or sustained,S1 and S2 within normal limits, no S3, no S4, no clicks, no rubs, *** murmurs ABD:  Flat, positive bowel sounds normal in frequency in pitch, no bruits, no rebound, no guarding, no midline pulsatile mass, no hepatomegaly, no splenomegaly EXT:  2 plus pulses throughout, no edema, no cyanosis no clubbing    ***GENERAL:  Well appearing NECK:  No jugular venous distention, waveform within normal limits, carotid upstroke brisk and symmetric, no bruits, no thyromegaly LUNGS:  Clear to auscultation bilaterally CHEST:  Unremarkable HEART:  PMI not displaced or sustained,S1 and S2 within normal limits, no S3, no S4, no clicks, no rubs, no murmurs ABD:  Flat, positive bowel sounds normal in frequency in pitch, no bruits, no rebound, no guarding, no midline pulsatile mass, no hepatomegaly, no splenomegaly EXT:  2 plus pulses throughout, no edema, no cyanosis no clubbing   EKG:  EKG is *** ordered today. The ekg ordered 08/06/2020 demonstrates normal sinus rhythm, rate ***, axis within normal limits, intervals within normal limits, no acute ST-T wave changes.   Recent Labs: 08/06/2020: ALT 47; BUN 12; Creatinine, Ser 0.70; Hemoglobin 17.0; Platelets 259; Potassium 4.2; Sodium 140    Lipid Panel No results found for: CHOL, TRIG,  HDL, CHOLHDL, VLDL, LDLCALC, LDLDIRECT    Wt Readings from Last 3 Encounters:  10/10/20 162 lb (73.5 kg)  08/26/20 165 lb 6.4 oz (75 kg)  08/18/20 166 lb 6.4 oz (75.5 kg)      Other studies Reviewed: Additional studies/ records that were reviewed today include: *** Review of the above records demonstrates:  Please see elsewhere in the note.     ASSESSMENT AND PLAN:  CVA:  ***  his was embolic to the right MCA.   Echo with bubble study was negative.   He was to have a monitor but he could not get it working at home he reports because of his Internet.  He became frustrated with that and they did send it back.    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  However, I still want to try to see if we can rule out arrhythmia and we will look to see what kind of monitor for whether he should get an implantable loop.  HTN: His blood pressure is *** controlled.  He will continue the meds as listed.   TOBACCO ABUSE:    *** We have talked about the need to stop smoking completely.  DYSLIPIDEMIA:  *** LDL is 153 previously with a goal LDL less than 70 and I will defer to his primary provider who is following this and recently adjusted his Lipitor.   ELEVATED CORONARY CALCIUM:   *** I will bring him back for POET (Plain Old Exercise Treadmill)  COVID VACCINE:   He has not wanted to talk about getting a vaccine.   Current medicines are reviewed at length with the patient today.  The patient does not have concerns regarding medicines.  The following changes have been made:  None  Labs/ tests ordered today include:     No orders of the defined types were placed in this encounter.    Disposition:   FU with me in  6 months.   Signed, Rollene Rotunda, MD  10/21/2020 8:36 PM    [facility]

## 2020-10-21 NOTE — Telephone Encounter (Signed)
Left a message for the patient to call back to see about ordering a new cardiac monitor. °

## 2020-10-22 ENCOUNTER — Ambulatory Visit: Payer: Medicare Other | Admitting: Cardiology

## 2020-10-22 ENCOUNTER — Telehealth: Payer: Self-pay | Admitting: *Deleted

## 2020-10-22 NOTE — Telephone Encounter (Signed)
called patient to discuss today's appointment. Per Dr Antoine Poche, patient does not need appointment today . Will need to reschedule for 6 month .    per Dr Antoine Poche , patient does not need w-ifi to wear monitor. If patient has not return monitor,patient should  wear it .

## 2020-12-03 ENCOUNTER — Ambulatory Visit: Payer: Medicare Other | Admitting: Physician Assistant

## 2021-03-05 ENCOUNTER — Telehealth: Payer: Self-pay | Admitting: *Deleted

## 2021-03-05 NOTE — Telephone Encounter (Signed)
A message was left,re: his follow up  Visit.

## 2021-06-16 IMAGING — CR DG ORBITS FOR FOREIGN BODY
3 series · 3 of 3 positions shown · non-contrast
Comparison: None.
COMPARISON: None.

Addendum:
CLINICAL DATA: Metal working/exposure; clearance prior to MRI

EXAM:
ORBITS FOR FOREIGN BODY - 2 VIEW

[w orbit pa (1 of 2)]
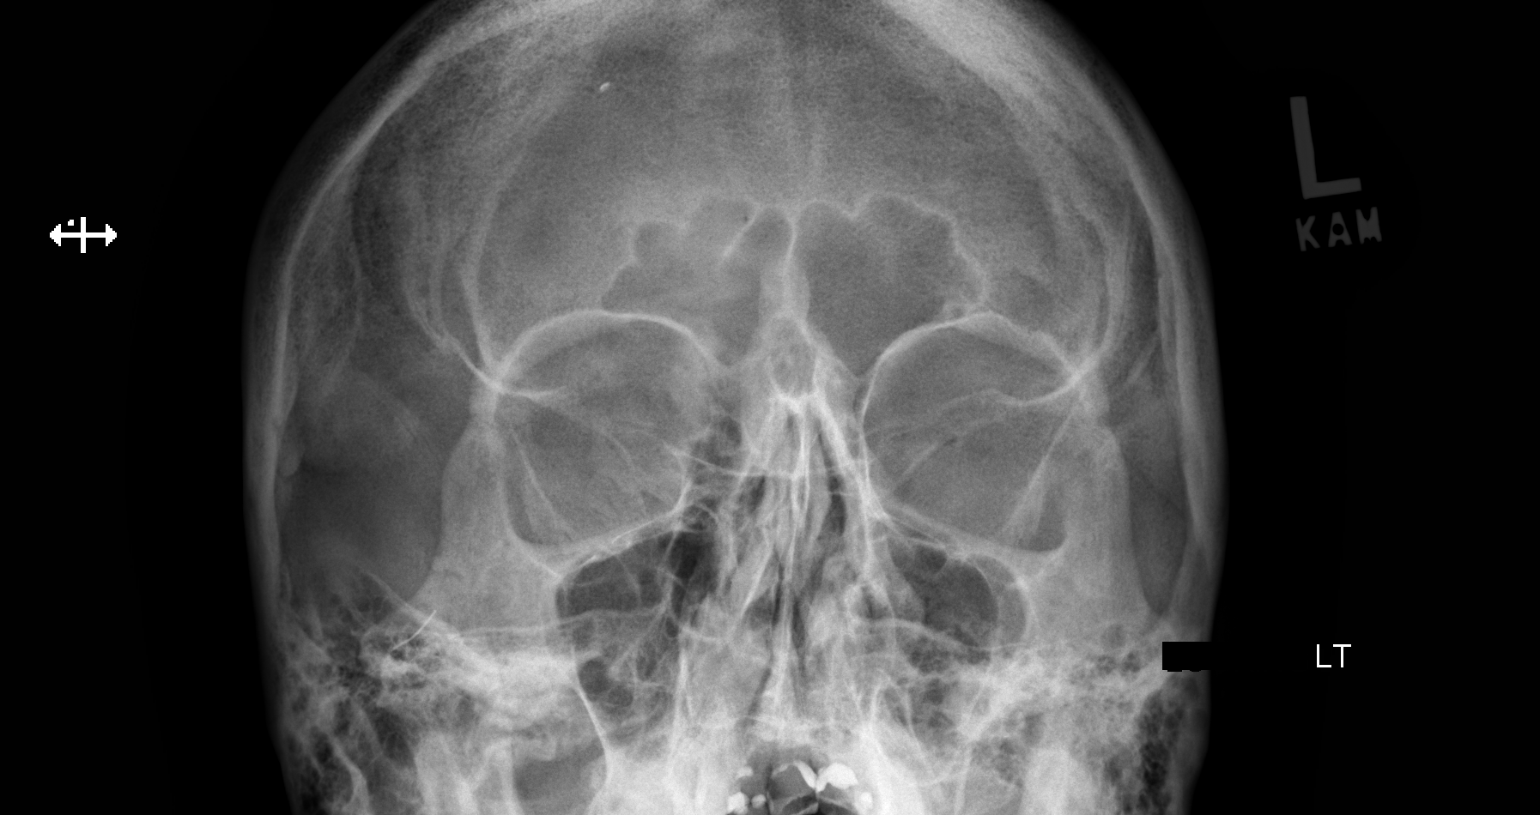

[w orbit pa (2 of 2)]
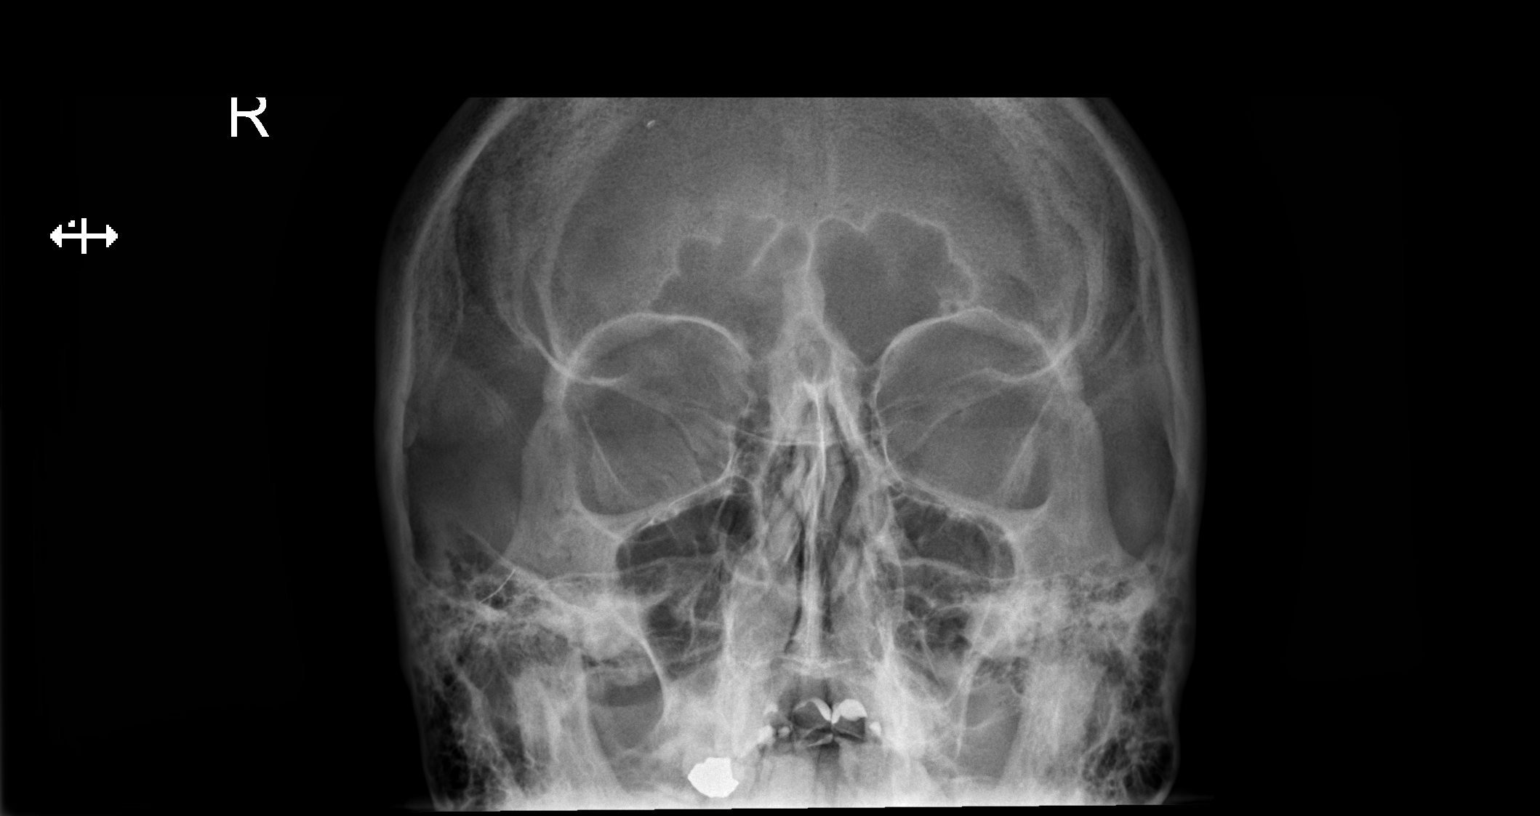

[w skull lat]
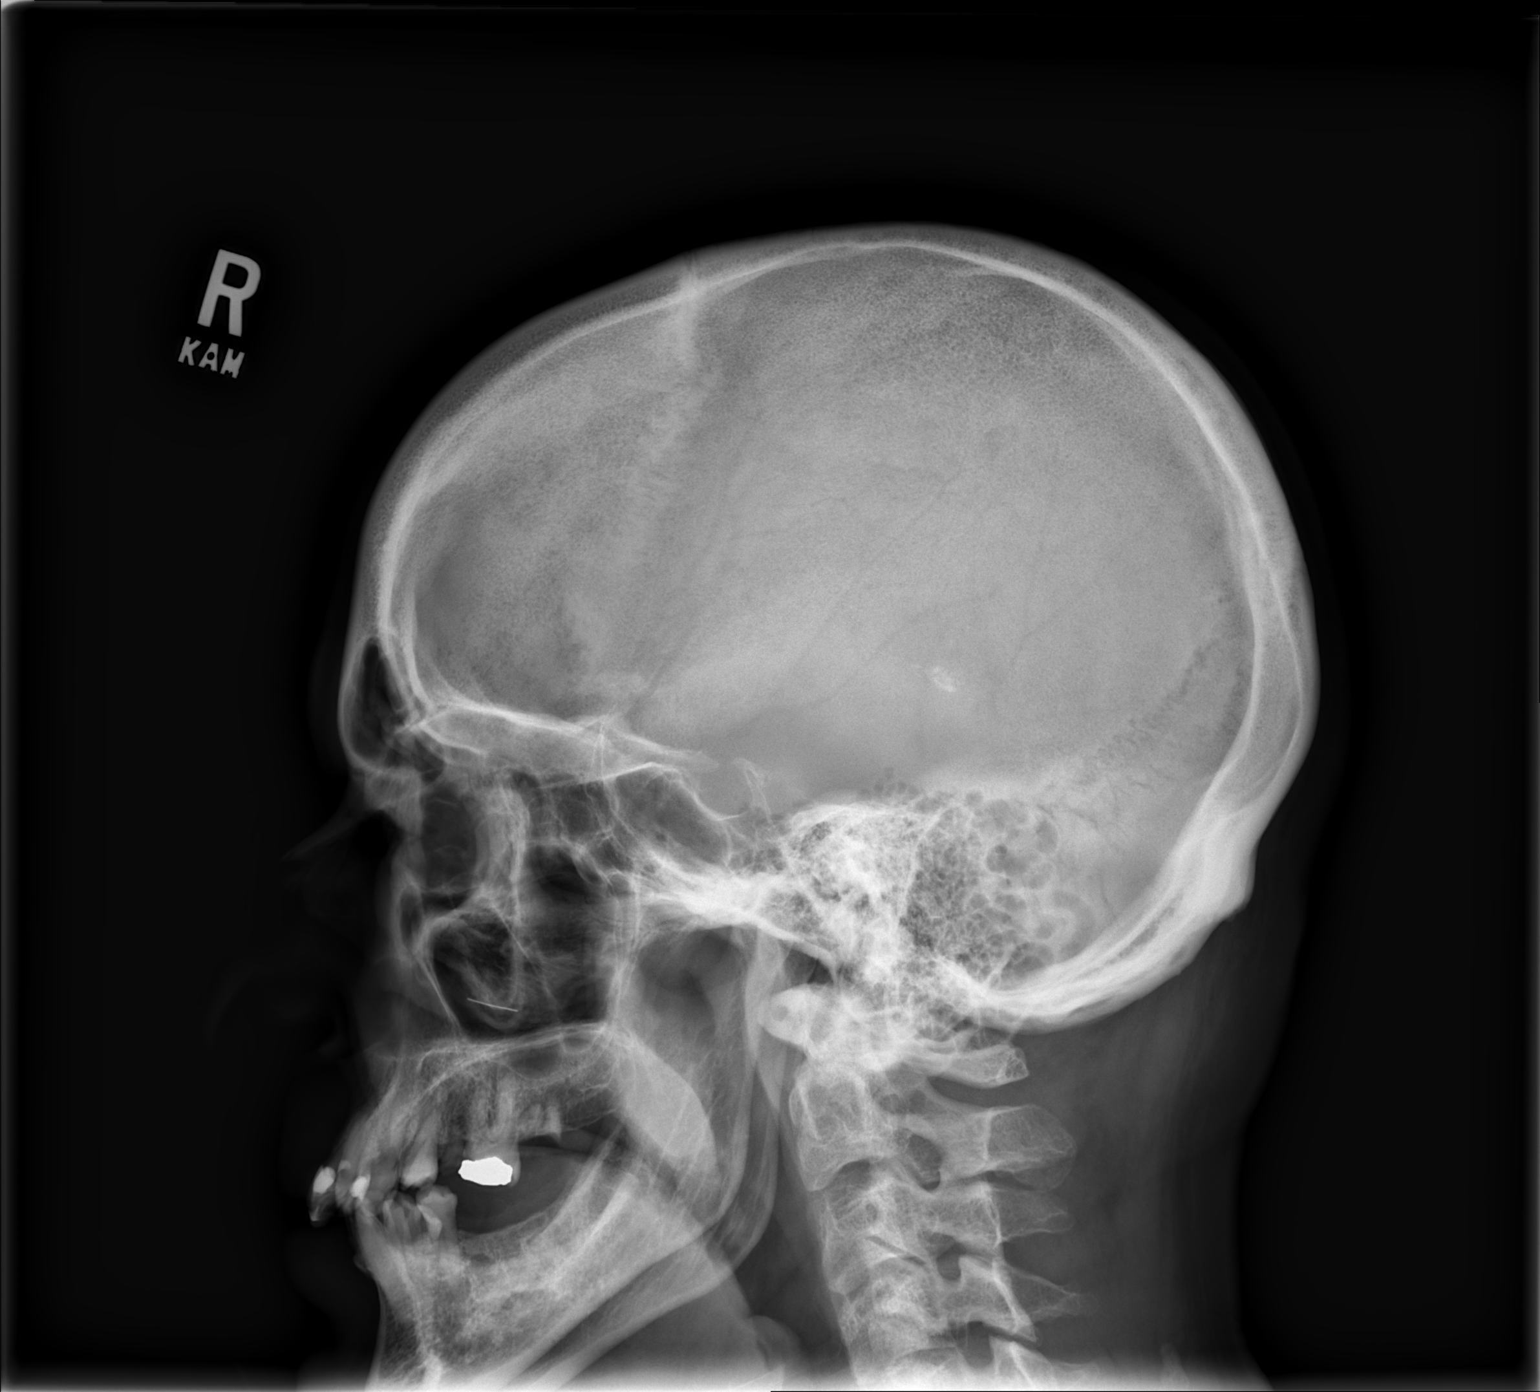

[3 of 3 positions shown; findings below may reference images not displayed]

FINDINGS: There is no evidence of metallic foreign body within the orbits. No
significant bone abnormality identified.

2 mm density overlying the right frontal bone well above the orbit.
Possible metal foreign body.

Metal wire is seen overlying the right mastoid sinus which could be
present anteriorly or posteriorly. No lateral view obtained.
IMPRESSION: No evidence of metallic foreign body within the orbits.

Additional foreign bodies are present overlying the right frontal
bone and right mastoid sinus as above. Recommend lateral view for
better localization of these foreign bodies.

ADDENDUM:
Additional lateral view was obtained. The metal wire appears to be
in the right facial soft tissues. This appears safe for MRI.

Small density overlying the right frontal bone on the frontal view
is not seen on the lateral view. This could be calcified or metal.
No intracranial metal foreign body identified.

No orbital foreign body.  Patient is cleared for MRI.

*** End of Addendum ***
FINDINGS: There is no evidence of metallic foreign body within the orbits. No
significant bone abnormality identified.

2 mm density overlying the right frontal bone well above the orbit.
Possible metal foreign body.

Metal wire is seen overlying the right mastoid sinus which could be
present anteriorly or posteriorly. No lateral view obtained.
IMPRESSION: No evidence of metallic foreign body within the orbits.

Additional foreign bodies are present overlying the right frontal
bone and right mastoid sinus as above. Recommend lateral view for
better localization of these foreign bodies.

## 2021-06-16 IMAGING — MR MR HEAD W/O CM
8 series · 48 of 48 positions shown · non-contrast
Comparison: None.

CLINICAL DATA: Stroke follow-up

EXAM:
MRI HEAD WITHOUT CONTRAST
MRA HEAD WITHOUT CONTRAST
TECHNIQUE: Multiplanar, multiecho pulse sequences of the brain and surrounding
structures were obtained without intravenous contrast. Angiographic
images of the head were obtained using MRA technique without
contrast.

[Series 2: T1 · sagittal · 5.0mm · 0.45mm/px · 3 of 21 slices shown]
[im 1/21]
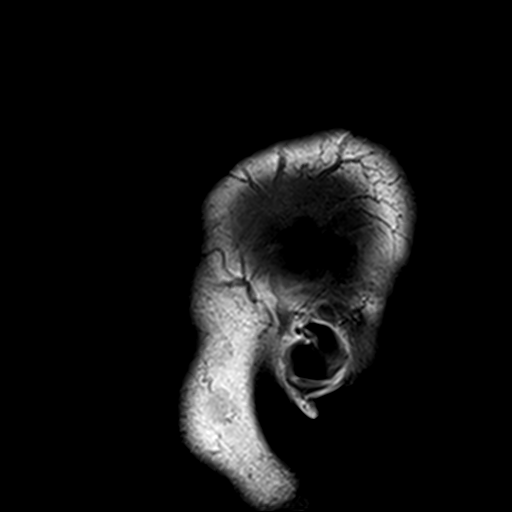
[im 11/21]
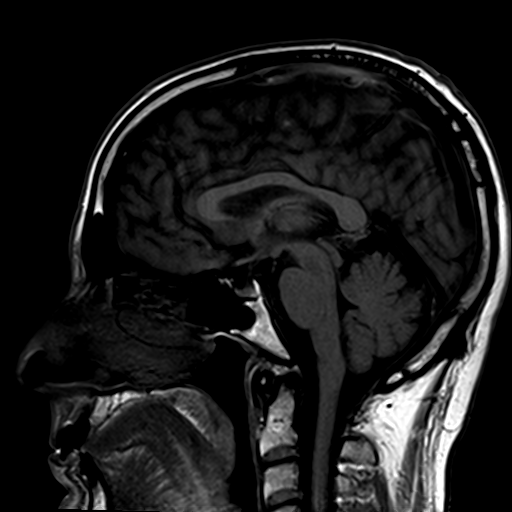
[im 21/21]
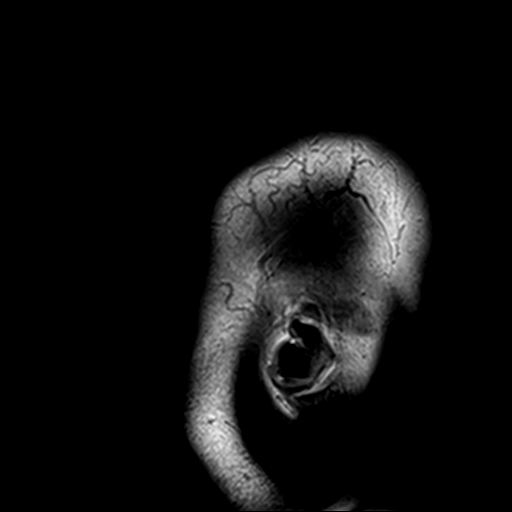

[Series 3: DWI · axial · 3.0mm · 1.80mm/px · z∈[-57,+99]mm · 12 of 106 slices shown (1 of 2)]
[im 1/106]
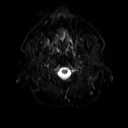
[im 10/106]
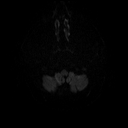
[im 20/106]
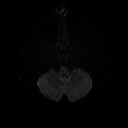
[im 29/106]
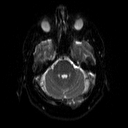
[im 39/106]
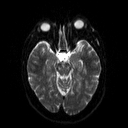
[im 48/106]
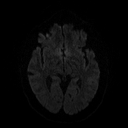
[im 58/106]
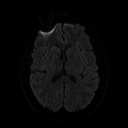
[im 67/106]
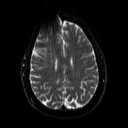
[im 77/106]
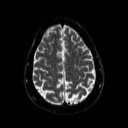
[im 86/106]
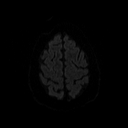
[im 96/106]
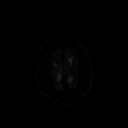
[im 106/106]
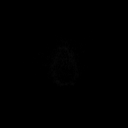

[Series 4: DWI · axial · 3.0mm · 1.80mm/px · z∈[-57,+99]mm · 5 of 50 slices shown (2 of 2)]
[im 1/50]
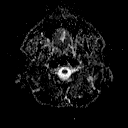
[im 13/50]
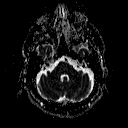
[im 25/50]
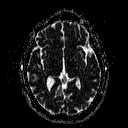
[im 37/50]
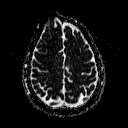
[im 50/50]
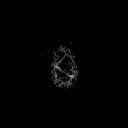

[Series 5: T2 · axial · 5.0mm · 0.51mm/px · z∈[-51,+95]mm · 2 of 22 slices shown (1 of 2)]
[im 1/22]
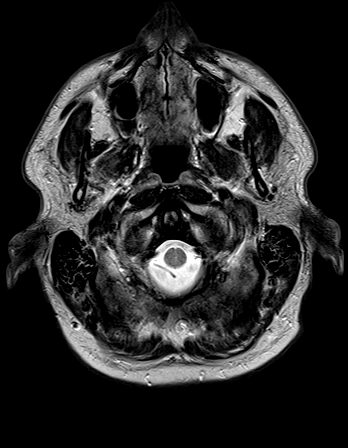
[im 22/22]
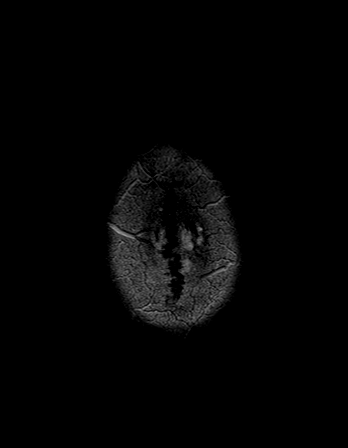

[Series 6: FLAIR · axial · 3.0mm · 0.45mm/px · z∈[-64,+99]mm · 4 of 36 slices shown]
[im 1/36]
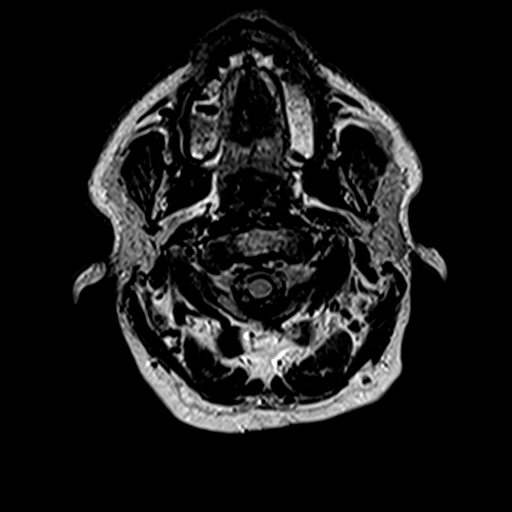
[im 12/36]
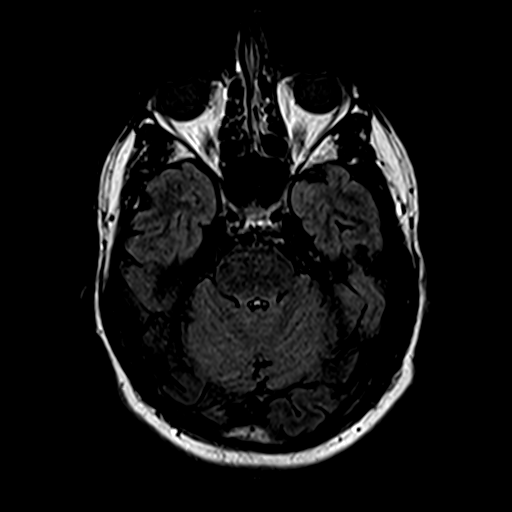
[im 24/36]
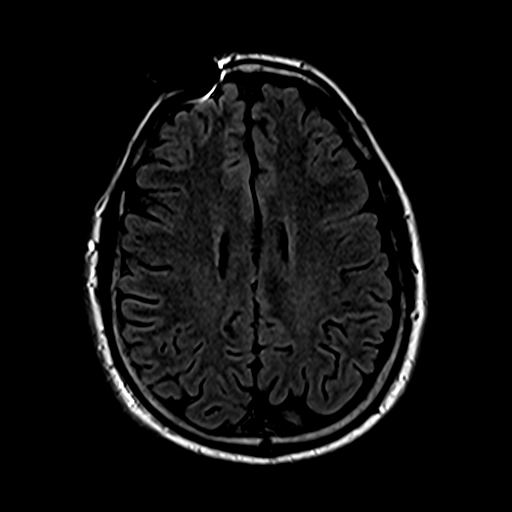
[im 36/36]
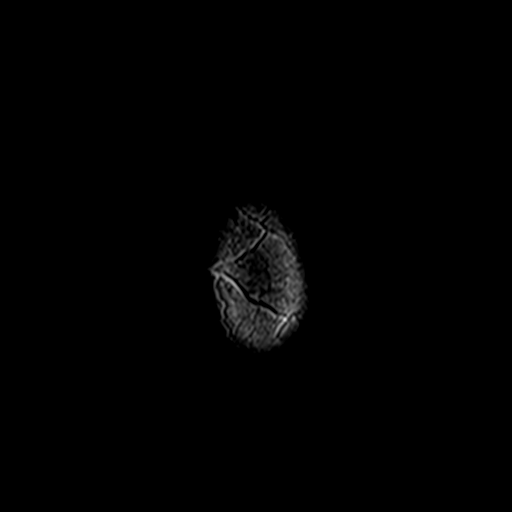

[Series 8: swi_images · axial · 4.0mm · 0.90mm/px · z∈[-60,+96]mm · 4 of 40 slices shown]
[im 1/40]
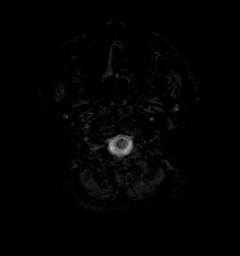
[im 14/40]
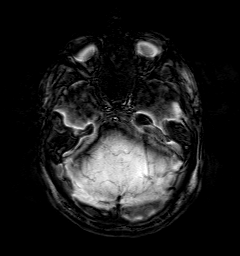
[im 27/40]
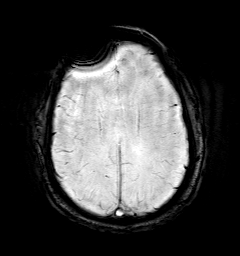
[im 40/40]
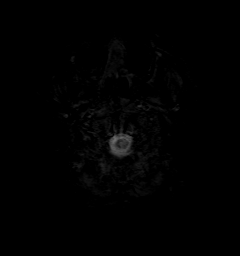

[Series 9: t1_mpr_tra · axial · 1.1mm · 0.71mm/px · z∈[-60,+97]mm · 15 of 144 slices shown]
[im 1/144]
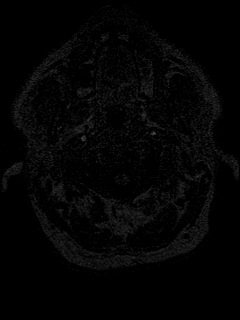
[im 11/144]
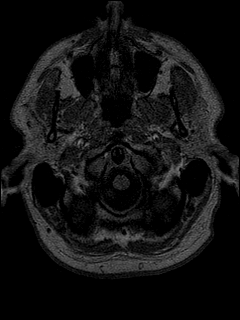
[im 21/144]
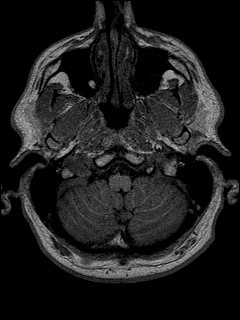
[im 31/144]
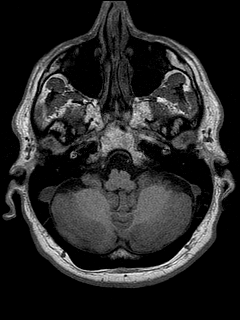
[im 41/144]
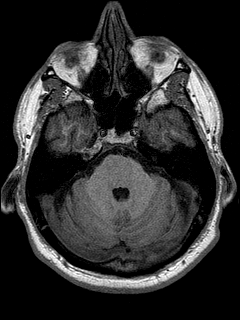
[im 52/144]
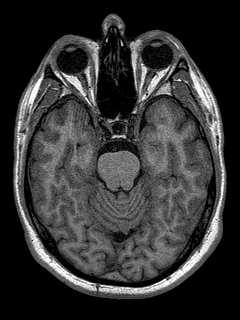
[im 62/144]
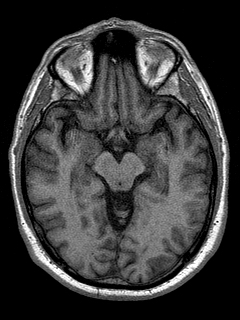
[im 72/144]
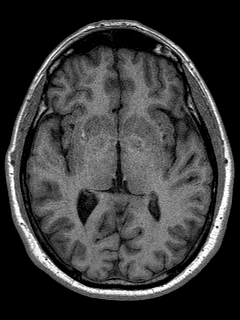
[im 82/144]
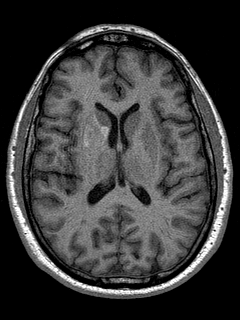
[im 92/144]
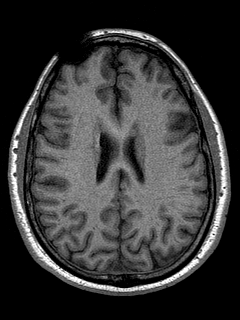
[im 103/144]
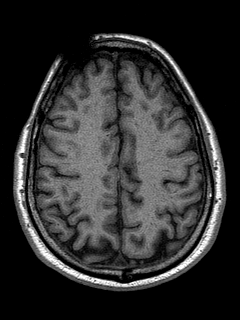
[im 113/144]
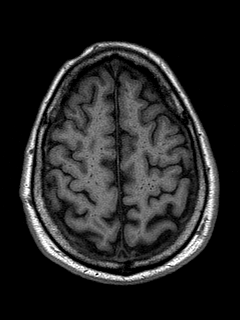
[im 123/144]
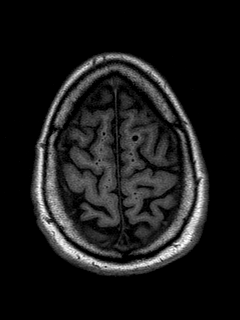
[im 133/144]
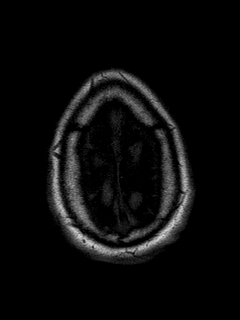
[im 144/144]
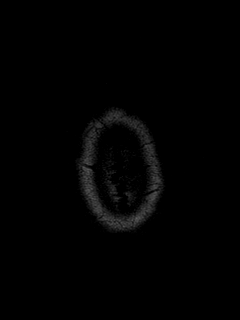

[Series 10: T2 · coronal · 5.0mm · 0.45mm/px · 3 of 27 slices shown (2 of 2)]
[im 1/27]
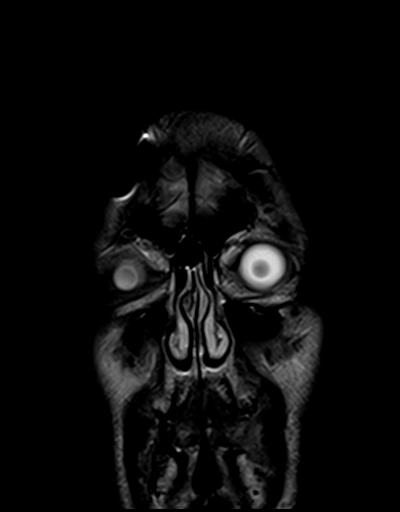
[im 14/27]
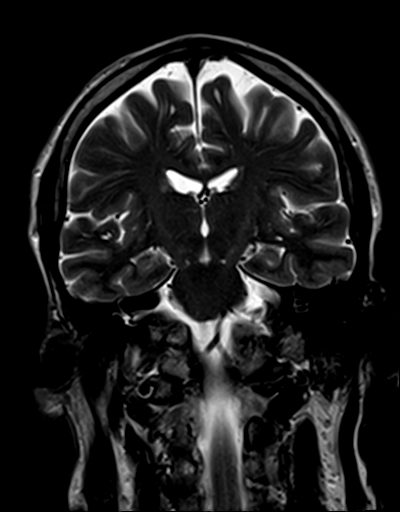
[im 27/27]
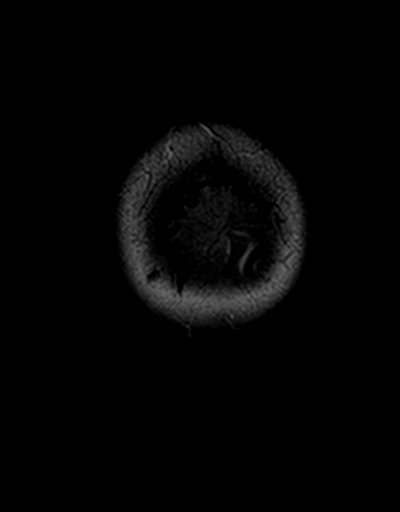

[48 of 48 positions shown; findings below may reference images not displayed]

FINDINGS: MRI HEAD FINDINGS

Brain: Subacute infarct of the right basal ganglia with pseudo
normalization. No acute infarct. Normal white matter signal. Normal
volume of CSF spaces. No chronic microhemorrhage. Normal midline
structures.

Vascular: Normal flow voids.

Skull and upper cervical spine: Normal marrow signal.

Sinuses/Orbits: Negative.

Other: None.

MRA HEAD FINDINGS

POSTERIOR CIRCULATION:

--Vertebral arteries: Normal V4 segments.

--Inferior cerebellar arteries: Normal.

--Basilar artery: Normal.

--Superior cerebellar arteries: Normal.

--Posterior cerebral arteries: Normal.

ANTERIOR CIRCULATION:

--Intracranial internal carotid arteries: Normal.

--Anterior cerebral arteries (ACA): Normal. Both A1 segments are
present. Patent anterior communicating artery (a-comm).

--Middle cerebral arteries (MCA): Normal.
IMPRESSION: 1. Expected evolution subacute right basal ganglia infarct. No
hemorrhage.
2. Normal intracranial MRA.

## 2022-02-12 ENCOUNTER — Encounter: Payer: Self-pay | Admitting: Cardiology

## 2022-08-15 ENCOUNTER — Emergency Department (HOSPITAL_BASED_OUTPATIENT_CLINIC_OR_DEPARTMENT_OTHER)
Admission: EM | Admit: 2022-08-15 | Discharge: 2022-08-15 | Disposition: A | Discharge status: Home / Self Care | Payer: Medicare Other | Attending: Emergency Medicine | Admitting: Emergency Medicine

## 2022-08-15 ENCOUNTER — Encounter (HOSPITAL_BASED_OUTPATIENT_CLINIC_OR_DEPARTMENT_OTHER): Payer: Self-pay | Admitting: Emergency Medicine

## 2022-08-15 ENCOUNTER — Other Ambulatory Visit: Payer: Self-pay

## 2022-08-15 ENCOUNTER — Emergency Department (HOSPITAL_BASED_OUTPATIENT_CLINIC_OR_DEPARTMENT_OTHER): Payer: Medicare Other

## 2022-08-15 DIAGNOSIS — S39012A Strain of muscle, fascia and tendon of lower back, initial encounter: Secondary | ICD-10-CM | POA: Insufficient documentation

## 2022-08-15 DIAGNOSIS — Z7982 Long term (current) use of aspirin: Secondary | ICD-10-CM | POA: Diagnosis not present

## 2022-08-15 DIAGNOSIS — Z7902 Long term (current) use of antithrombotics/antiplatelets: Secondary | ICD-10-CM | POA: Diagnosis not present

## 2022-08-15 DIAGNOSIS — X500XXA Overexertion from strenuous movement or load, initial encounter: Secondary | ICD-10-CM | POA: Diagnosis not present

## 2022-08-15 DIAGNOSIS — S3992XA Unspecified injury of lower back, initial encounter: Secondary | ICD-10-CM | POA: Diagnosis present

## 2022-08-15 MED ORDER — LIDOCAINE 5 % EX PTCH
1.0000 | MEDICATED_PATCH | Freq: Once | CUTANEOUS | Status: DC
  Administered 2022-08-15: 1 via TRANSDERMAL
  Filled 2022-08-15: qty 1

## 2022-08-15 MED ORDER — LIDOCAINE 5 % EX PTCH: 1.0000 | MEDICATED_PATCH | CUTANEOUS | 0 refills | Status: AC

## 2022-08-15 MED ORDER — OXYCODONE-ACETAMINOPHEN 5-325 MG PO TABS
1.0000 | ORAL_TABLET | Freq: Once | ORAL | Status: AC
  Administered 2022-08-15: 1 via ORAL
  Filled 2022-08-15: qty 1

## 2022-08-15 MED ORDER — METHOCARBAMOL 500 MG PO TABS: 500.0000 mg | ORAL_TABLET | Freq: Two times a day (BID) | ORAL | 0 refills | Status: AC

## 2022-08-15 MED ORDER — DEXAMETHASONE SODIUM PHOSPHATE 10 MG/ML IJ SOLN
10.0000 mg | Freq: Once | INTRAMUSCULAR | Status: AC
  Administered 2022-08-15: 10 mg via INTRAMUSCULAR
  Filled 2022-08-15: qty 1

## 2022-08-15 MED ORDER — PREDNISONE 20 MG PO TABS: 40.0000 mg | ORAL_TABLET | Freq: Every day | ORAL | 0 refills | Status: AC

## 2022-08-15 MED ORDER — KETOROLAC TROMETHAMINE 15 MG/ML IJ SOLN
15.0000 mg | Freq: Once | INTRAMUSCULAR | Status: AC
  Administered 2022-08-15: 15 mg via INTRAMUSCULAR
  Filled 2022-08-15: qty 1

## 2022-08-15 NOTE — Discharge Instructions (Addendum)
It was a pleasure taking care of you today!   Your CT was negative for fracture. You are prescribed Robaxin, lidoderm patches, and a prednisone taper.  Take medications as prescribed.  Do not operate any heavy machinery or drive while taking Robaxin.  You may apply ice or heat to the affected area for up to 15 minutes at a time.  Ensure to place a barrier between your skin and the ice or heat.  Attached is information for the on-call orthopedist, you may call and set up a follow-up appointment regarding today's ED visit.  Return to the Emergency Department if you are experiencing loss of bowel or bladder, increasing/worsening symptoms, fever, inability to walk.

## 2022-08-15 NOTE — ED Provider Notes (Signed)
[facility] EMERGENCY DEPT Provider Note   CSN: [id number] Arrival date & time: 08/15/22  1331     History  Chief Complaint  Patient presents with   Back Pain    Kevin Clarke. is a 56 y.o. male who presents to the emergency department with concerns for right lower back pain onset 3 weeks.  Notes that 3 weeks ago he was lifting up a kayak trailer when he felt pain to the area.  This pain is exacerbated 3 days ago when he went to lift up the kayak trailer again and felt a pop to his right lower back.  Has tried over-the-counter medication without relief of his symptoms.  Denies bowel/bladder incontinence, abdominal pain, vomiting.  The history is provided by the patient. No language interpreter was used.       Home Medications Prior to Admission medications   Medication Sig Start Date End Date Taking? Authorizing Provider  lidocaine (LIDODERM) 5 % Place 1 patch onto the skin daily. Remove & Discard patch within 12 hours or as directed by MD 08/15/22  Yes Nanako Stopher A, PA-C  methocarbamol (ROBAXIN) 500 MG tablet Take 1 tablet (500 mg total) by mouth 2 (two) times daily. 08/15/22  Yes Clela Hagadorn A, PA-C  predniSONE (DELTASONE) 20 MG tablet Take 2 tablets (40 mg total) by mouth daily for 5 days. 08/15/22 08/20/22 Yes Berit Raczkowski A, PA-C  aspirin EC 81 MG tablet Take 162 mg by mouth daily. Swallow whole.    [provider]  atorvastatin (LIPITOR) 10 MG tablet Take 10 mg by mouth daily. 08/13/20   [provider]  clopidogrel (PLAVIX) 75 MG tablet Take 75 mg by mouth daily. 08/11/20   [provider]  co-enzyme Q-10 30 MG capsule Take 30 mg by mouth daily.     [provider]      Allergies    Corn-containing products, Rosuvastatin, and Penicillins    Review of Systems   Review of Systems  Musculoskeletal:  Positive for back pain.  All other systems reviewed and are negative.   Physical Exam Updated Vital Signs BP (!) 153/89    Pulse 73   Temp 97.8 F (36.6 C) (Oral)   Resp 16   SpO2 99%  Physical Exam Vitals and nursing note reviewed.  Constitutional:      General: He is not in acute distress.    Appearance: He is not diaphoretic.  HENT:     Head: Normocephalic and atraumatic.     Mouth/Throat:     Pharynx: No oropharyngeal exudate.  Eyes:     General: No scleral icterus.    Conjunctiva/sclera: Conjunctivae normal.  Cardiovascular:     Rate and Rhythm: Normal rate and regular rhythm.     Pulses: Normal pulses.     Heart sounds: Normal heart sounds.  Pulmonary:     Effort: Pulmonary effort is normal. No respiratory distress.     Breath sounds: Normal breath sounds. No wheezing.  Abdominal:     General: Bowel sounds are normal.     Palpations: Abdomen is soft. There is no mass.     Tenderness: There is no abdominal tenderness. There is no guarding or rebound.  Musculoskeletal:        General: Normal range of motion.     Cervical back: Normal range of motion and neck supple.     Comments: No spinal tenderness to palpation.  No tenderness to palpation noted to musculature of back.  Positive straight  leg raise noted on the right.  Able to ambulate without assistance or difficulty.  Skin:    General: Skin is warm and dry.  Neurological:     Mental Status: He is alert.  Psychiatric:        Behavior: Behavior normal.     ED Results / Procedures / Treatments   Labs (all labs ordered are listed, but only abnormal results are displayed) Labs Reviewed - No data to display  EKG None  Radiology CT Lumbar Spine Wo Contrast  Result Date: 08/15/2022 CLINICAL DATA:  Back trauma, no prior imaging (Age >= 16y) EXAM: CT LUMBAR SPINE WITHOUT CONTRAST TECHNIQUE: Multidetector CT imaging of the lumbar spine was performed without intravenous contrast administration. Multiplanar CT image reconstructions were also generated. RADIATION DOSE REDUCTION: This exam was performed according to the departmental  dose-optimization program which includes automated exposure control, adjustment of the mA and/or kV according to patient size and/or use of iterative reconstruction technique. COMPARISON:  None Available. FINDINGS: Segmentation: 5 lumbar type vertebrae. Alignment: Normal. Vertebrae: No acute fracture or focal pathologic process. Paraspinal and other soft tissues: Aortic atherosclerosis. Disc levels: Minimal disc height loss at L3-4, asymmetric to the right with degenerative endplate spurring, likely causing a degree of right-sided foraminal stenosis. There is mild endplate spurring on the right at L3-4, also likely resulting in a degree of right-sided foraminal stenosis. No evidence to suggest high-grade canal stenosis within the lumbar spine by CT. Disc height loss is present at the T11-12 level in the lower thoracic spine. IMPRESSION: 1. No acute fracture or traumatic malalignment of the lumbar spine. 2. Degenerative disc disease most pronounced at L3-4, as above. 3. Aortic atherosclerosis (ICD10-I70.0). Electronically Signed   By: Duanne Guess D.O.   On: 08/15/2022 17:21    Procedures Procedures    Medications Ordered in ED Medications  ketorolac (TORADOL) 15 MG/ML injection 15 mg (15 mg Intramuscular Given 08/15/22 1650)  dexamethasone (DECADRON) injection 10 mg (10 mg Intramuscular Given 08/15/22 1650)  oxyCODONE-acetaminophen (PERCOCET/ROXICET) 5-325 MG per tablet 1 tablet (1 tablet Oral Given 08/15/22 1807)    ED Course/ Medical Decision Making/ A&P Clinical Course as of 08/16/22 2111  Sun Aug 15, 2022  1732 Re-evaluated and patient noted mild improvement of his symptoms with treatment regimen in the ED.  Discussed with patient CT findings.  Discussed discharge treatment plan.  Patient agreeable at this time.  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  However notes that he is taken muscle relaxers before. [SB]    Clinical Course User Index [SB] Demeisha Geraghty A, PA-C                           Medical Decision Making Amount and/or Complexity of Data Reviewed Radiology: ordered.  Risk Prescription drug management.   Patient with right lumbar back pain. No neurological deficits and normal neuro exam.  Patient is ambulatory without assistance or difficulty.  No loss of bowel or bladder control.  No concern for cauda equina.  No fever, night sweats, weight loss, h/o cancer, IVDA, no recent procedure to back. No urinary symptoms suggestive of UTI.  Vital signs, pt afebrile. On exam, patient with No spinal tenderness to palpation.  No tenderness to palpation noted to musculature of back.  Positive straight leg raise noted on the right.  Able to ambulate without assistance or difficulty. No concerning findings on exams. Differential diagnosis includes but is not limited to fracture, herniation, muscle strain, cauda equina, pancreatitis, appendicitis, pyelonephritis, nephrolithiasis.     Imaging: I ordered imaging studies including CT lumbar spine I independently visualized and interpreted imaging which showed:  1. No acute fracture or traumatic malalignment of the lumbar spine.  2. Degenerative disc disease most pronounced at L3-4, as above.  3. Aortic atherosclerosis (ICD10-I70.0).   I agree with the radiologist interpretation  Medications:  I ordered medication including toradol, percocet, lidoderm patch for pain management Reevaluation of the patient after these medicines and interventions, I reevaluated the patient and found that they have improved I have reviewed the patients home medicines and have made adjustments as needed    Disposition: Presentation suspicious for strain of right lumbar spine. Doubt acute fracture, herniation. After  consideration of the diagnostic results and the patients response to treatment, I feel that the patient would benefit from Discharge home. Info for orthopedist given. Scripts sent for robaxin, prednisone, and lidoderm patch. Discussed importance of follow-up with primary care provider for management.  Supportive care measures and strict return precautions discussed with patient at bedside. Pt acknowledges and verbalizes understanding. Pt appears safe for discharge. Follow up as indicated in discharge paperwork.    This chart was dictated using voice recognition software, Dragon. Despite the best efforts of this provider to proofread and correct errors, errors may still occur which can change documentation meaning.   Final Clinical Impression(s) / ED Diagnoses Final diagnoses:  Strain of lumbar region, initial encounter    Rx / DC Orders ED Discharge Orders          Ordered    methocarbamol (ROBAXIN) 500 MG tablet  2 times daily        08/15/22 1815    predniSONE (DELTASONE) 20 MG tablet  Daily        08/15/22 1815    lidocaine (LIDODERM) 5 %  Every 24 hours        08/15/22 1815              Starleen Trussell A, PA-C 08/16/22 2112    Wyvonnia Dusky, MD 08/17/22 [phone]

## 2022-08-15 NOTE — ED Triage Notes (Signed)
Pt hasnt been taking his plavix for few months.

## 2022-08-15 NOTE — ED Notes (Signed)
Pt ambulatory to room with assistance of family. Pt endorses lower back pain.

## 2022-08-15 NOTE — ED Notes (Signed)
Pt denies allergy to corn containing products. States he has taken muscle relaxer and percocet before with no issues.

## 2022-08-15 NOTE — ED Triage Notes (Signed)
Pt here POV, c/o back pain/ pt states was kayaking about a week ago, back strained, got better, then he lifted boat trailor and hurt again. Right lower back, sharp /shooting /shoot down right leg.
